# Patient Record
Sex: Female | Born: 1970 | Race: Black or African American | Hispanic: No | Marital: Married | Smoking: Never smoker
Health system: Southern US, Community
[De-identification: ages and names within clinical notes are randomized; demographics above are authoritative.]

## PROBLEM LIST (undated history)

## (undated) DIAGNOSIS — K219 Gastro-esophageal reflux disease without esophagitis: Secondary | ICD-10-CM

## (undated) DIAGNOSIS — E7800 Pure hypercholesterolemia, unspecified: Secondary | ICD-10-CM

## (undated) DIAGNOSIS — I10 Essential (primary) hypertension: Secondary | ICD-10-CM

## (undated) HISTORY — DX: Pure hypercholesterolemia, unspecified: E78.00

---

## 2014-01-01 ENCOUNTER — Encounter (HOSPITAL_BASED_OUTPATIENT_CLINIC_OR_DEPARTMENT_OTHER): Payer: Self-pay

## 2014-01-01 ENCOUNTER — Emergency Department (HOSPITAL_BASED_OUTPATIENT_CLINIC_OR_DEPARTMENT_OTHER): Payer: Commercial Managed Care - PPO

## 2014-01-01 ENCOUNTER — Emergency Department (HOSPITAL_BASED_OUTPATIENT_CLINIC_OR_DEPARTMENT_OTHER)
Admission: EM | Admit: 2014-01-01 | Discharge: 2014-01-01 | Disposition: A | Discharge status: Home / Self Care | Payer: Commercial Managed Care - PPO | Attending: Emergency Medicine | Admitting: Emergency Medicine

## 2014-01-01 DIAGNOSIS — R079 Chest pain, unspecified: Secondary | ICD-10-CM

## 2014-01-01 DIAGNOSIS — R0789 Other chest pain: Secondary | ICD-10-CM | POA: Diagnosis not present

## 2014-01-01 DIAGNOSIS — I10 Essential (primary) hypertension: Secondary | ICD-10-CM | POA: Diagnosis not present

## 2014-01-01 DIAGNOSIS — R109 Unspecified abdominal pain: Secondary | ICD-10-CM | POA: Diagnosis present

## 2014-01-01 DIAGNOSIS — Z8719 Personal history of other diseases of the digestive system: Secondary | ICD-10-CM | POA: Insufficient documentation

## 2014-01-01 HISTORY — DX: Gastro-esophageal reflux disease without esophagitis: K21.9

## 2014-01-01 HISTORY — DX: Essential (primary) hypertension: I10

## 2014-01-01 LAB — BASIC METABOLIC PANEL
ANION GAP: 12 (ref 5–15)
BUN: 11 mg/dL (ref 6–23)
CHLORIDE: 104 meq/L (ref 96–112)
CO2: 24 meq/L (ref 19–32)
CREATININE: 1 mg/dL (ref 0.50–1.10)
Calcium: 9.5 mg/dL (ref 8.4–10.5)
GFR calc Af Amer: 78 mL/min — ABNORMAL LOW (ref >90)
GFR calc non Af Amer: 67 mL/min — ABNORMAL LOW (ref >90)
Glucose, Bld: 95 mg/dL (ref 70–99)
Potassium: 3.6 mEq/L — ABNORMAL LOW (ref 3.7–5.3)
Sodium: 140 mEq/L (ref 137–147)

## 2014-01-01 LAB — CBC WITH DIFFERENTIAL/PLATELET
Basophils Absolute: 0 10*3/uL (ref 0.0–0.1)
Basophils Relative: 1 % (ref 0–1)
Eosinophils Absolute: 0.1 10*3/uL (ref 0.0–0.7)
Eosinophils Relative: 1 % (ref 0–5)
HEMATOCRIT: 36.4 % (ref 36.0–46.0)
Hemoglobin: 13.2 g/dL (ref 12.0–15.0)
LYMPHS ABS: 1.8 10*3/uL (ref 0.7–4.0)
LYMPHS PCT: 41 % (ref 12–46)
MCH: 30.3 pg (ref 26.0–34.0)
MCHC: 36.3 g/dL — ABNORMAL HIGH (ref 30.0–36.0)
MCV: 83.7 fL (ref 78.0–100.0)
MONO ABS: 0.3 10*3/uL (ref 0.1–1.0)
Monocytes Relative: 8 % (ref 3–12)
NEUTROS ABS: 2.2 10*3/uL (ref 1.7–7.7)
NEUTROS PCT: 49 % (ref 43–77)
Platelets: 239 10*3/uL (ref 150–400)
RBC: 4.35 MIL/uL (ref 3.87–5.11)
RDW: 13.4 % (ref 11.5–15.5)
WBC: 4.4 10*3/uL (ref 4.0–10.5)

## 2014-01-01 LAB — TROPONIN I: Troponin I: <0.3 ng/mL (ref <0.30)

## 2014-01-01 LAB — D-DIMER, QUANTITATIVE (NOT AT ARMC)

## 2014-01-01 MED ORDER — PANTOPRAZOLE SODIUM 40 MG IV SOLR
40.0000 mg | Freq: Once | INTRAVENOUS | Status: AC
  Administered 2014-01-01: 40 mg via INTRAVENOUS
  Filled 2014-01-01: qty 40

## 2014-01-01 MED ORDER — PANTOPRAZOLE SODIUM 20 MG PO TBEC: 20.0000 mg | DELAYED_RELEASE_TABLET | Freq: Every day | ORAL | Status: AC

## 2014-01-01 MED ORDER — RANITIDINE HCL 150 MG PO CAPS: 150.0000 mg | ORAL_CAPSULE | Freq: Every day | ORAL | Status: DC

## 2014-01-01 NOTE — ED Provider Notes (Signed)
PT presents with epigastric/substernal chest pain.  Delta troponins negative, serial EKGS show nonspecific ST/T wave changes.  I got a copy of a prior EKG from her cardilogist's office which appears similar to the EKGs done today.  PT has had similar pain intermittently for the last year, has seen both cards and GI and it was felt to by related to reflux.  Pain is non-exertional, HEART score 3.  Will d/c home with cards f/u.  Given return precautions.  Results for orders placed or performed during the hospital encounter of 01/01/14  CBC with Differential  Result Value Ref Range   WBC 4.4 4.0 - 10.5 K/uL   RBC 4.35 3.87 - 5.11 MIL/uL   Hemoglobin 13.2 12.0 - 15.0 g/dL   HCT 91.436.4 78.236.0 - 95.646.0 %   MCV 83.7 78.0 - 100.0 fL   MCH 30.3 26.0 - 34.0 pg   MCHC 36.3 (H) 30.0 - 36.0 g/dL   RDW 21.313.4 08.611.5 - 57.815.5 %   Platelets 239 150 - 400 K/uL   Neutrophils Relative % 49 43 - 77 %   Neutro Abs 2.2 1.7 - 7.7 K/uL   Lymphocytes Relative 41 12 - 46 %   Lymphs Abs 1.8 0.7 - 4.0 K/uL   Monocytes Relative 8 3 - 12 %   Monocytes Absolute 0.3 0.1 - 1.0 K/uL   Eosinophils Relative 1 0 - 5 %   Eosinophils Absolute 0.1 0.0 - 0.7 K/uL   Basophils Relative 1 0 - 1 %   Basophils Absolute 0.0 0.0 - 0.1 K/uL  Basic metabolic panel  Result Value Ref Range   Sodium 140 137 - 147 mEq/L   Potassium 3.6 (L) 3.7 - 5.3 mEq/L   Chloride 104 96 - 112 mEq/L   CO2 24 19 - 32 mEq/L   Glucose, Bld 95 70 - 99 mg/dL   BUN 11 6 - 23 mg/dL   Creatinine, Ser 4.691.00 0.50 - 1.10 mg/dL   Calcium 9.5 8.4 - 62.910.5 mg/dL   GFR calc non Af Amer 67 (L) >90 mL/min   GFR calc Af Amer 78 (L) >90 mL/min   Anion gap 12 5 - 15  Troponin I  Result Value Ref Range   Troponin I <0.30 <0.30 ng/mL  D-dimer, quantitative  Result Value Ref Range   D-Dimer, Quant <0.27 0.00 - 0.48 ug/mL-FEU  Troponin I  Result Value Ref Range   Troponin I <0.30 <0.30 ng/mL   Dg Chest 2 View  01/01/2014   CLINICAL DATA:  Left chest pain for 4 hr.  EXAM:  CHEST  2 VIEW  COMPARISON:  None.  FINDINGS: Two views of the chest demonstrate elevation of the right hemidiaphragm which may be related to eventration. The lungs are clear. Heart and mediastinum are within normal limits. The trachea is midline. No acute bone abnormality. There is a well-defined nodular density in the right hilum and suspect this represents a large calcification and old granulomatous disease.  IMPRESSION: No acute cardiopulmonary disease.  Elevated right hemidiaphragm as described.  Suspect old granulomatous disease.   Electronically Signed   By: Richarda OverlieAdam  Henn M.D.   On: 01/01/2014 07:04      Rolan BuccoMelanie Daneli Butkiewicz, MD 01/01/14 1104

## 2014-01-01 NOTE — Discharge Instructions (Signed)

## 2014-01-01 NOTE — ED Provider Notes (Signed)
CSN: [id number]     Arrival date & time 01/01/14  0606 History   First MD Initiated Contact with Patient 01/01/14 [phone]     Chief Complaint  Patient presents with  . Abdominal Pain      Patient is a 43 y.o. female presenting with chest pain. The history is provided by the patient.  Chest Pain Pain location:  Substernal area Pain quality: sharp   Radiates to: left chest. Pain radiates to the back: no   Pain severity:  Moderate Onset quality:  Sudden Duration:  4 hours Timing:  Constant Progression:  Unchanged Chronicity:  New Relieved by:  Nothing Worsened by:  Deep breathing Associated symptoms: no abdominal pain, no diaphoresis, no fever, no lower extremity edema, no shortness of breath and not vomiting   Risk factors: hypertension   Risk factors: no coronary artery disease, no diabetes mellitus and no prior DVT/PE   Pt reports waking up with CP about 4 hrs ago It is not improved with home medications She has not had this pain recently She reports h/o reflux and she thought that might be the cause of her pain   Family history - mother with CAD  Past Medical History  Diagnosis Date  . Hypertension   . Acid reflux     History  Substance Use Topics  . Smoking status: Never Smoker   . Smokeless tobacco: Not on file  . Alcohol Use: No   OB History    No data available     Review of Systems  Constitutional: Negative for fever and diaphoresis.  Respiratory: Negative for shortness of breath.   Cardiovascular: Positive for chest pain. Negative for leg swelling.  Gastrointestinal: Negative for vomiting and abdominal pain.  Neurological: Negative for syncope.  All other systems reviewed and are negative.     Allergies  Biaxin  Home Medications   Prior to Admission medications   Medication Sig Start Date End Date Taking? Authorizing Provider  cholecalciferol (VITAMIN D) 1000 UNITS tablet Take 1,000 Units by mouth daily.   Yes Historical Provider, MD   BP  148/96 mmHg  Pulse 92  Temp(Src) 98.9 F (37.2 C) (Oral)  Resp 20  SpO2 100% Physical Exam CONSTITUTIONAL: Well developed/well nourished HEAD: Normocephalic/atraumatic EYES: EOMI/PERRL ENMT: Mucous membranes moist NECK: supple no meningeal signs SPINE/BACK:entire spine nontender CV: S1/S2 noted, no murmurs/rubs/gallops noted Chest : mild tenderness to palpation LUNGS: Lungs are clear to auscultation bilaterally, no apparent distress ABDOMEN: soft, nontender, no RUQ tenderness, no rebound or guarding, bowel sounds noted throughout abdomen GU:no cva tenderness NEURO: Pt is awake/alert/appropriate, moves all extremitiesx4.  No facial droop.   EXTREMITIES: pulses normal/equal, full ROM, no LE edema SKIN: warm, color normal PSYCH: no abnormalities of mood noted, alert and oriented to situation  ED Course  Procedures  6:42 AM It was initially reported patient had abdominal pain but she denies this on my evaluation She has pleuritic features, but also reproducible CP CXR/labs pending She reports h/o cardiac workup over a year ago with negative stress test.   6:55 AM Pt reports feeling improved Workup pending at this time 7:04 AM At signout to Dr Fredderick PhenixBelfi, followup on labs/imaging.  Would benefit from repeat troponin at 930am.  If repeat ekg/troponin negative would be appropriate for outpatient management  Labs Review Labs Reviewed  CBC WITH DIFFERENTIAL - Abnormal; Notable for the following:    MCHC 36.3 (*)    All other components within normal limits  BASIC METABOLIC PANEL - Abnormal;  Notable for the following:    Potassium 3.6 (*)    GFR calc non Af Amer 67 (*)    GFR calc Af Amer 78 (*)    All other components within normal limits  TROPONIN I  D-DIMER, QUANTITATIVE    Imaging Review No results found.   EKG Interpretation   Date/Time:  Friday January 01 2014 06:16:20 EST Ventricular Rate:  89 PR Interval:  142 QRS Duration: 74 QT Interval:  388 QTC Calculation:  472 R Axis:   10 Text Interpretation:  Normal sinus rhythm Non-specific ST-t changes  Abnormal ekg No previous ECGs available Confirmed by Bebe ShaggyWICKLINE  MD, Dorinda HillNALD  [id number]) on 01/01/2014 6:19:33 AM      MDM   Final diagnoses:  Chest pain    Nursing notes including past medical history and social history reviewed and considered in documentation Labs/vital reviewed myself and considered during evaluation xrays/imaging reviewed by myself and considered during evaluation     Joya Gaskinsonald W Irisha Grandmaison, MD 01/01/14 [id number]

## 2014-01-01 NOTE — ED Notes (Signed)
Patient transported to X-ray 

## 2014-01-01 NOTE — ED Notes (Signed)
Pt c/o epigastric pain radiating to under lt breast since 2am, states hx of acid reflux, took her pecid, zantac with no relief; pt denies SOB, nausea of diaphoresis

## 2016-07-03 IMAGING — CR DG CHEST 2V
2 series · 2 of 2 positions shown · non-contrast
Comparison: None.

CLINICAL DATA: Left chest pain for 4 hr.

EXAM:
CHEST  2 VIEW

[w chest pa]
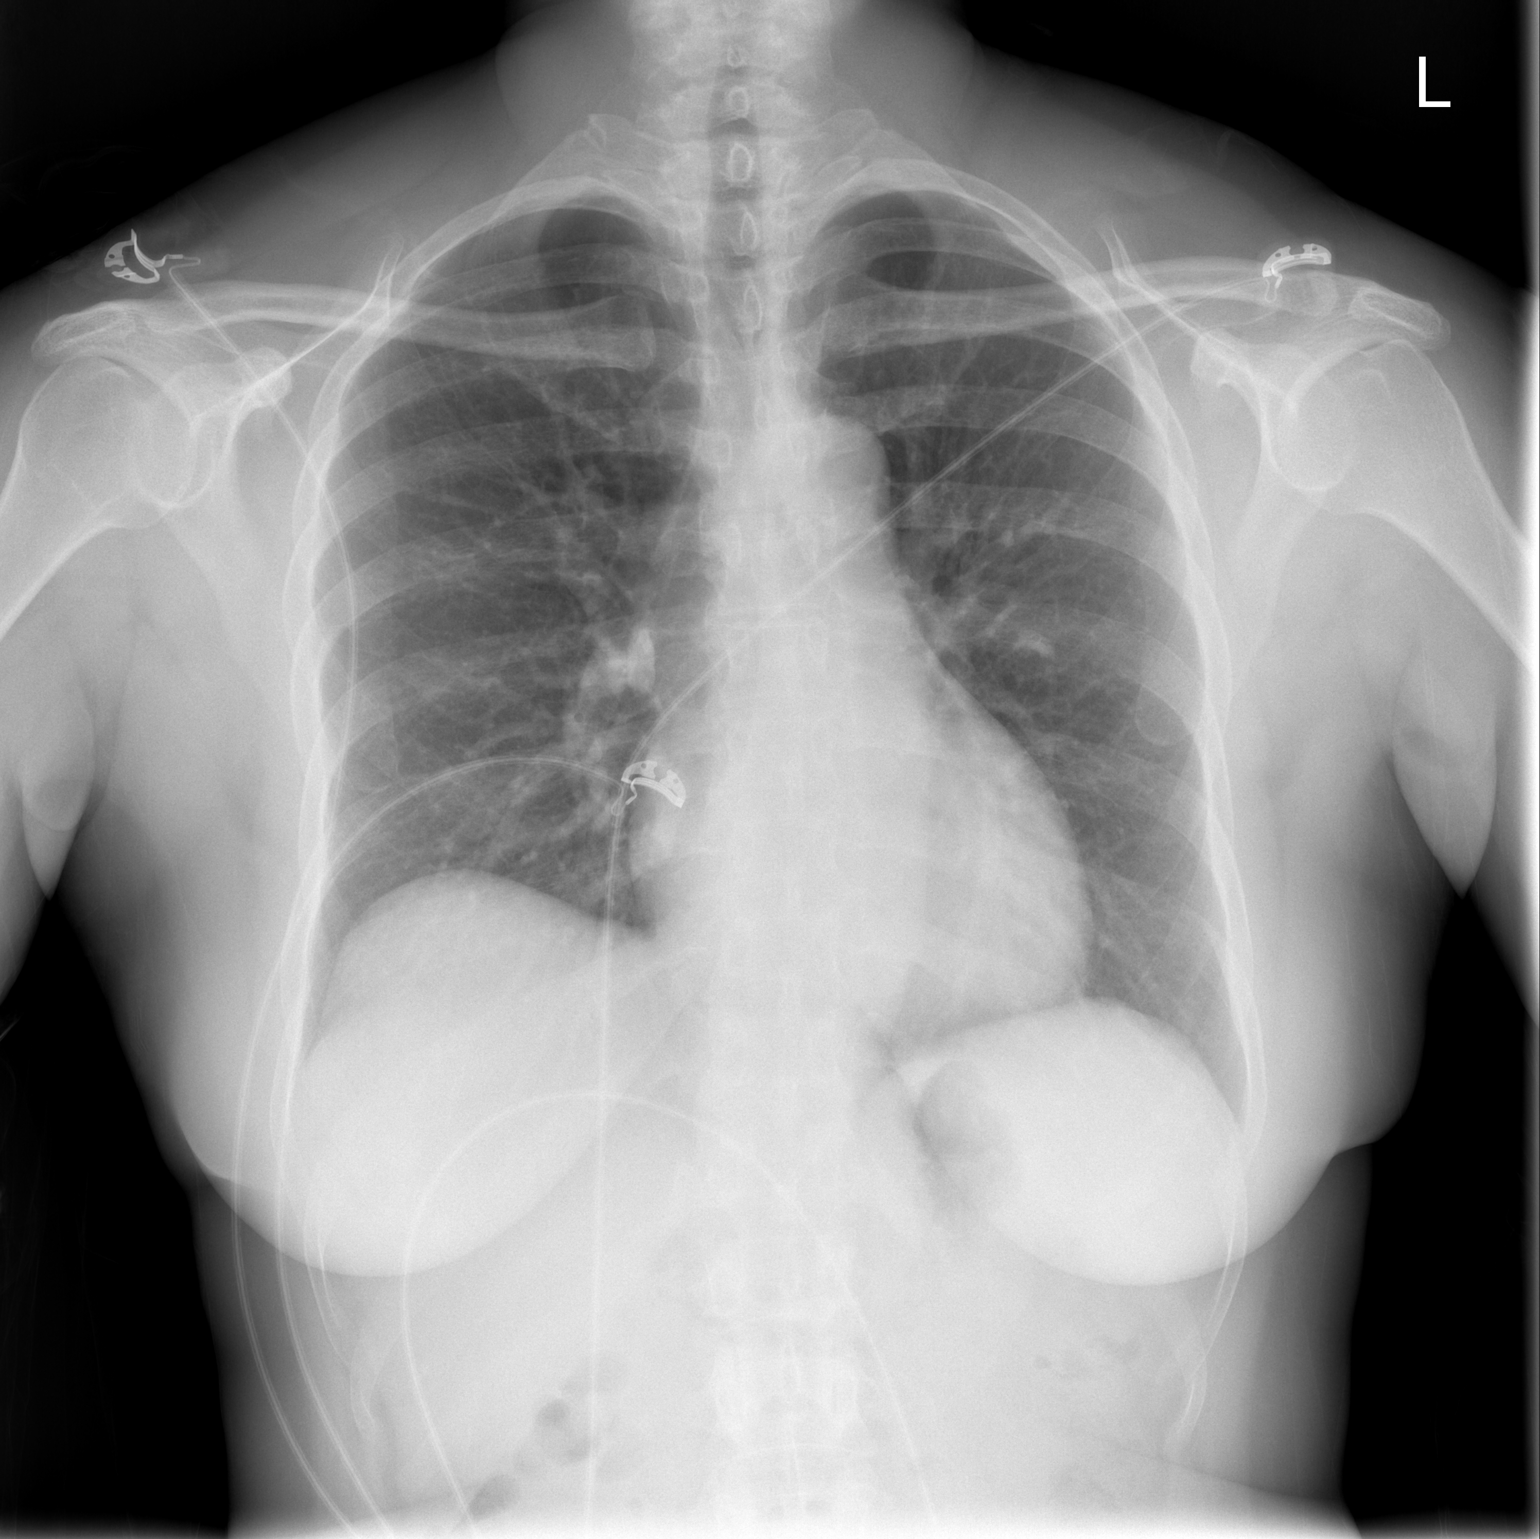

[w chest lat]
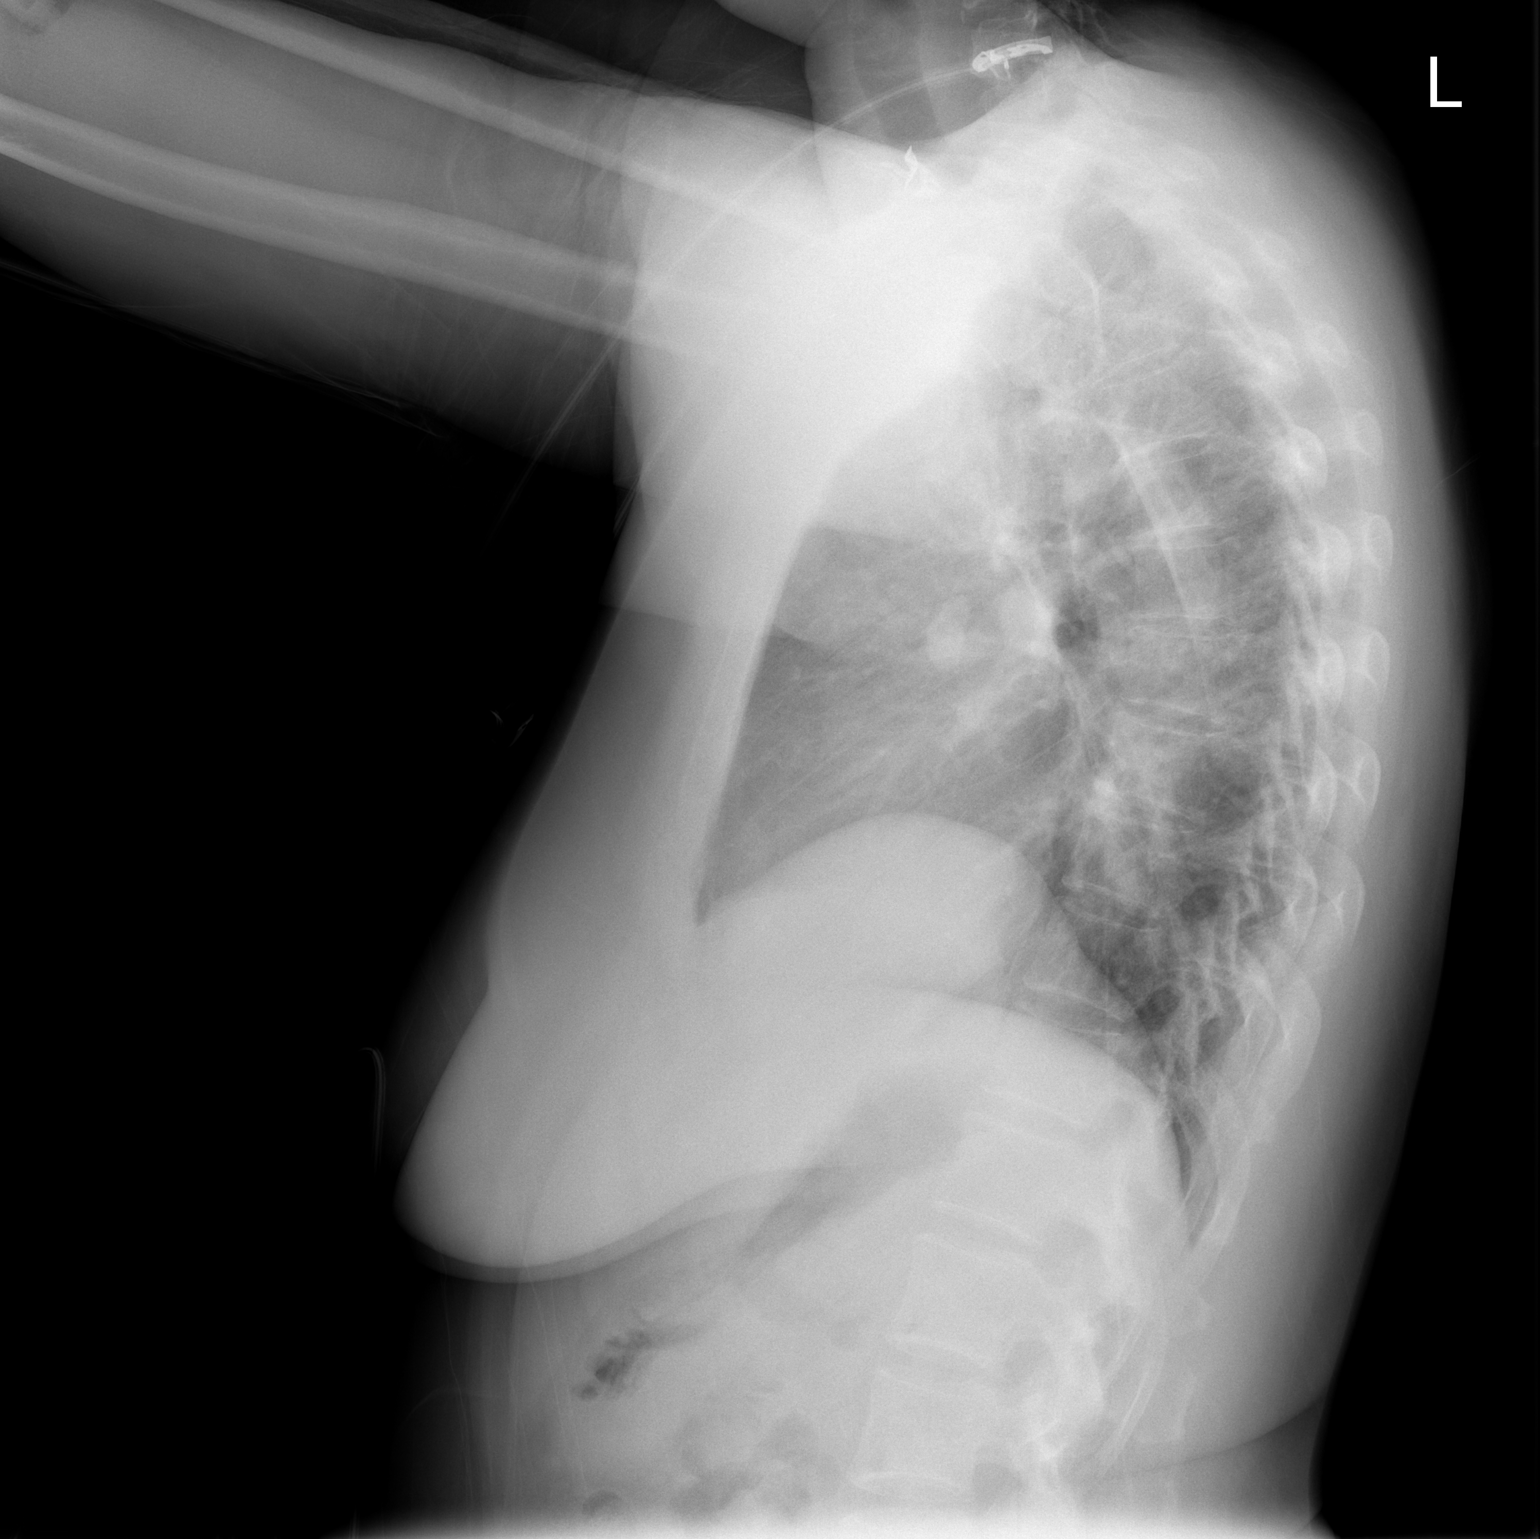

[2 of 2 positions shown; findings below may reference images not displayed]

FINDINGS: Two views of the chest demonstrate elevation of the right
hemidiaphragm which may be related to eventration. The lungs are
clear. Heart and mediastinum are within normal limits. The trachea
is midline. No acute bone abnormality. There is a well-defined
nodular density in the right hilum and suspect this represents a
large calcification and old granulomatous disease.
IMPRESSION: No acute cardiopulmonary disease.

Elevated right hemidiaphragm as described.

Suspect old granulomatous disease.

## 2021-10-14 ENCOUNTER — Encounter (HOSPITAL_BASED_OUTPATIENT_CLINIC_OR_DEPARTMENT_OTHER): Payer: Self-pay | Admitting: Emergency Medicine

## 2021-10-14 ENCOUNTER — Emergency Department (HOSPITAL_BASED_OUTPATIENT_CLINIC_OR_DEPARTMENT_OTHER): Payer: BLUE CROSS/BLUE SHIELD

## 2021-10-14 ENCOUNTER — Other Ambulatory Visit: Payer: Self-pay

## 2021-10-14 ENCOUNTER — Emergency Department (HOSPITAL_BASED_OUTPATIENT_CLINIC_OR_DEPARTMENT_OTHER)
Admission: EM | Admit: 2021-10-14 | Discharge: 2021-10-15 | Disposition: A | Discharge status: Home / Self Care | Payer: BLUE CROSS/BLUE SHIELD | Attending: Emergency Medicine | Admitting: Emergency Medicine

## 2021-10-14 DIAGNOSIS — K5792 Diverticulitis of intestine, part unspecified, without perforation or abscess without bleeding: Secondary | ICD-10-CM | POA: Diagnosis not present

## 2021-10-14 DIAGNOSIS — I10 Essential (primary) hypertension: Secondary | ICD-10-CM | POA: Insufficient documentation

## 2021-10-14 DIAGNOSIS — R1012 Left upper quadrant pain: Secondary | ICD-10-CM | POA: Diagnosis present

## 2021-10-14 LAB — URINALYSIS, ROUTINE W REFLEX MICROSCOPIC
Bilirubin Urine: NEGATIVE
Glucose, UA: NEGATIVE mg/dL
Ketones, ur: NEGATIVE mg/dL
Leukocytes,Ua: NEGATIVE
Nitrite: NEGATIVE
Protein, ur: NEGATIVE mg/dL
Specific Gravity, Urine: >=1.03 (ref 1.005–1.030)
pH: 6 (ref 5.0–8.0)

## 2021-10-14 LAB — URINALYSIS, MICROSCOPIC (REFLEX)

## 2021-10-14 LAB — CBC WITH DIFFERENTIAL/PLATELET
Abs Immature Granulocytes: 0.03 10*3/uL (ref 0.00–0.07)
Basophils Absolute: 0 10*3/uL (ref 0.0–0.1)
Basophils Relative: 0 %
Eosinophils Absolute: 0.1 10*3/uL (ref 0.0–0.5)
Eosinophils Relative: 1 %
HCT: 40.1 % (ref 36.0–46.0)
Hemoglobin: 14.5 g/dL (ref 12.0–15.0)
Immature Granulocytes: 0 %
Lymphocytes Relative: 27 %
Lymphs Abs: 2.6 10*3/uL (ref 0.7–4.0)
MCH: 29.2 pg (ref 26.0–34.0)
MCHC: 36.2 g/dL — ABNORMAL HIGH (ref 30.0–36.0)
MCV: 80.7 fL (ref 80.0–100.0)
Monocytes Absolute: 0.6 10*3/uL (ref 0.1–1.0)
Monocytes Relative: 6 %
Neutro Abs: 6.5 10*3/uL (ref 1.7–7.7)
Neutrophils Relative %: 66 %
Platelets: 297 10*3/uL (ref 150–400)
RBC: 4.97 MIL/uL (ref 3.87–5.11)
RDW: 13.6 % (ref 11.5–15.5)
WBC: 9.8 10*3/uL (ref 4.0–10.5)
nRBC: 0 % (ref 0.0–0.2)

## 2021-10-14 LAB — COMPREHENSIVE METABOLIC PANEL
ALT: 22 U/L (ref 0–44)
AST: 25 U/L (ref 15–41)
Albumin: 4.2 g/dL (ref 3.5–5.0)
Alkaline Phosphatase: 114 U/L (ref 38–126)
Anion gap: 8 (ref 5–15)
BUN: 9 mg/dL (ref 6–20)
CO2: 28 mmol/L (ref 22–32)
Calcium: 9.7 mg/dL (ref 8.9–10.3)
Chloride: 103 mmol/L (ref 98–111)
Creatinine, Ser: 0.82 mg/dL (ref 0.44–1.00)
GFR, Estimated: >60 mL/min (ref >60)
Glucose, Bld: 109 mg/dL — ABNORMAL HIGH (ref 70–99)
Potassium: 3.5 mmol/L (ref 3.5–5.1)
Sodium: 139 mmol/L (ref 135–145)
Total Bilirubin: 0.6 mg/dL (ref 0.3–1.2)
Total Protein: 8.7 g/dL — ABNORMAL HIGH (ref 6.5–8.1)

## 2021-10-14 LAB — PREGNANCY, URINE: Preg Test, Ur: NEGATIVE

## 2021-10-14 LAB — LIPASE, BLOOD: Lipase: 29 U/L (ref 11–51)

## 2021-10-14 MED ORDER — IOHEXOL 300 MG/ML  SOLN
100.0000 mL | Freq: Once | INTRAMUSCULAR | Status: AC | PRN
  Administered 2021-10-14: 100 mL via INTRAVENOUS

## 2021-10-14 MED ORDER — SODIUM CHLORIDE 0.9 % IV BOLUS
1000.0000 mL | Freq: Once | INTRAVENOUS | Status: AC
  Administered 2021-10-14: 1000 mL via INTRAVENOUS

## 2021-10-14 MED ORDER — ONDANSETRON HCL 4 MG/2ML IJ SOLN
4.0000 mg | Freq: Once | INTRAMUSCULAR | Status: AC
  Administered 2021-10-14: 4 mg via INTRAVENOUS
  Filled 2021-10-14: qty 2

## 2021-10-14 MED ORDER — MORPHINE SULFATE (PF) 4 MG/ML IV SOLN
4.0000 mg | Freq: Once | INTRAVENOUS | Status: AC
  Administered 2021-10-14: 4 mg via INTRAVENOUS
  Filled 2021-10-14: qty 1

## 2021-10-14 NOTE — ED Triage Notes (Signed)
Intermittent lower abdominal pain since 7 am this morning. Describes pain as dull, then sharp. Denies n/v/d, fever, urinary sx, vaginal discharge.

## 2021-10-14 NOTE — ED Provider Notes (Signed)
[facility] EMERGENCY DEPARTMENT Provider Note   CSN: [id number] Arrival date & time: 10/14/21  2150     History  Chief Complaint  Patient presents with   Abdominal Pain    Chelsea Sullivan is a 51 y.o. female.  Patient is a 50 year old female with past medical history of hypertension and hyperlipidemia.  Patient presenting today with complaints of abdominal pain.  She describes periumbilical pain that started 2 days ago.  It lasted throughout the day, then resolved.  The pain returned this morning and has been intermittent and worsening throughout the day.  She describes cramping to the periumbilical region and left upper and left lower quadrant.  She denies any fevers or chills.  She denies any bowel or bladder complaints.  Pain is worse with movement and palpation.  There are no alleviating factors.  The history is provided by the patient.  Abdominal Pain Pain location:  Suprapubic, LUQ and LLQ Pain quality: cramping   Pain radiates to:  Does not radiate Pain severity:  Moderate Onset quality:  Gradual Duration:  2 days Timing:  Intermittent Progression:  Worsening Chronicity:  New      Home Medications Prior to Admission medications   Medication Sig Start Date End Date Taking? Authorizing Provider  cholecalciferol (VITAMIN D) 1000 UNITS tablet Take 1,000 Units by mouth daily.    [provider]  pantoprazole (PROTONIX) 20 MG tablet Take 1 tablet (20 mg total) by mouth daily. 01/01/14   Rolan Bucco, MD  ranitidine (ZANTAC) 150 MG capsule Take 1 capsule (150 mg total) by mouth daily. 01/01/14 01/01/14  Rolan Bucco, MD      Allergies    Biaxin [clarithromycin]    Review of Systems   Review of Systems  Gastrointestinal:  Positive for abdominal pain.  All other systems reviewed and are negative.   Physical Exam Updated Vital Signs BP (!) 137/93 (BP Location: Left Arm)   Pulse 89   Temp 98.9 F (37.2 C) (Oral)   Resp 17   Ht 5\' 4"   (1.626 m)   Wt 81.6 kg   SpO2 97%   BMI 30.90 kg/m  Physical Exam Vitals and nursing note reviewed.  Constitutional:      General: She is not in acute distress.    Appearance: She is well-developed. She is not diaphoretic.  HENT:     Head: Normocephalic and atraumatic.  Cardiovascular:     Rate and Rhythm: Normal rate and regular rhythm.     Heart sounds: No murmur heard.    No friction rub. No gallop.  Pulmonary:     Effort: Pulmonary effort is normal. No respiratory distress.     Breath sounds: Normal breath sounds. No wheezing.  Abdominal:     General: Bowel sounds are normal. There is no distension.     Palpations: Abdomen is soft.     Tenderness: There is abdominal tenderness in the periumbilical area, left upper quadrant and left lower quadrant. There is no right CVA tenderness, left CVA tenderness, guarding or rebound.  Musculoskeletal:        General: Normal range of motion.     Cervical back: Normal range of motion and neck supple.  Skin:    General: Skin is warm and dry.  Neurological:     General: No focal deficit present.     Mental Status: She is alert and oriented to person, place, and time.     ED Results / Procedures / Treatments   Labs (all  labs ordered are listed, but only abnormal results are displayed) Labs Reviewed  COMPREHENSIVE METABOLIC PANEL - Abnormal; Notable for the following components:      Result Value   Glucose, Bld 109 (*)    Total Protein 8.7 (*)    All other components within normal limits  URINALYSIS, ROUTINE W REFLEX MICROSCOPIC - Abnormal; Notable for the following components:   Hgb urine dipstick TRACE (*)    All other components within normal limits  CBC WITH DIFFERENTIAL/PLATELET - Abnormal; Notable for the following components:   MCHC 36.2 (*)    All other components within normal limits  URINALYSIS, MICROSCOPIC (REFLEX) - Abnormal; Notable for the following components:   Bacteria, UA RARE (*)    All other components within  normal limits  LIPASE, BLOOD  PREGNANCY, URINE    EKG None  Radiology No results found.  Procedures Procedures  {Document cardiac monitor, telemetry assessment procedure when appropriate:1}  Medications Ordered in ED Medications  sodium chloride 0.9 % bolus 1,000 mL (has no administration in time range)    ED Course/ Medical Decision Making/ A&P                           Medical Decision Making Amount and/or Complexity of Data Reviewed Labs: ordered. Radiology: ordered.   ***  {Document critical care time when appropriate:1} {Document review of labs and clinical decision tools ie heart score, Chads2Vasc2 etc:1}  {Document your independent review of radiology images, and any outside records:1} {Document your discussion with family members, caretakers, and with consultants:1} {Document social determinants of health affecting pt's care:1} {Document your decision making why or why not admission, treatments were needed:1} Final Clinical Impression(s) / ED Diagnoses Final diagnoses:  None    Rx / DC Orders ED Discharge Orders     None

## 2021-10-15 MED ORDER — METRONIDAZOLE 500 MG PO TABS: 500.0000 mg | ORAL_TABLET | Freq: Three times a day (TID) | ORAL | 0 refills | Status: AC

## 2021-10-15 MED ORDER — HYDROCODONE-ACETAMINOPHEN 5-325 MG PO TABS: 1.0000 | ORAL_TABLET | Freq: Four times a day (QID) | ORAL | 0 refills | Status: AC | PRN

## 2021-10-15 MED ORDER — CIPROFLOXACIN HCL 500 MG PO TABS: 500.0000 mg | ORAL_TABLET | Freq: Two times a day (BID) | ORAL | 0 refills | Status: AC

## 2021-10-15 NOTE — Discharge Instructions (Addendum)
Begin taking Cipro and Flagyl as prescribed.  Begin taking hydrocodone as prescribed as needed for pain.  Follow-up with your primary doctor if not improving in the next few days, and return to the ER if you develop worsening pain, high fever, bloody stools, or for other new and concerning symptoms.
